# Patient Record
Sex: Male | Born: 2000 | Race: White | Hispanic: No | Marital: Single | Smoking: Never smoker
Health system: Southern US, Community
[De-identification: ages and names within clinical notes are randomized; demographics above are authoritative.]

## PROBLEM LIST (undated history)

## (undated) DIAGNOSIS — Z8489 Family history of other specified conditions: Secondary | ICD-10-CM

## (undated) DIAGNOSIS — K219 Gastro-esophageal reflux disease without esophagitis: Secondary | ICD-10-CM

## (undated) DIAGNOSIS — Z789 Other specified health status: Secondary | ICD-10-CM

---
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
## 2018-10-23 ENCOUNTER — Encounter (HOSPITAL_COMMUNITY): Payer: Self-pay | Admitting: *Deleted

## 2018-10-23 NOTE — Progress Notes (Signed)
Mr Mcmurry asked me to speak to his mother- Emanuel Stroble, with him on the phone also. Mr. Brister denies chest pain or shortness of breath. Patient denies that he nor his family has experienced any of the following: Cough Fever >100.4 Runny Nose Sore Throat Difficulty breathing/ shortness of breath Travel in past 14 days- no Patient is scheduled for COVID test at 1230 on Tuesday.  I instructed patient to not eat after midnight. I informed patient that he could have clear liquids until 1000.  I went over what clear liquids consist of with patient and his mother. They voiced understanding.

## 2018-10-27 ENCOUNTER — Encounter (HOSPITAL_COMMUNITY): Payer: Self-pay | Admitting: *Deleted

## 2018-10-27 ENCOUNTER — Encounter (HOSPITAL_COMMUNITY)
Admission: RE | Disposition: A | Discharge status: Home / Self Care | Payer: Self-pay | Source: Home / Self Care | Attending: Orthopedic Surgery

## 2018-10-27 ENCOUNTER — Other Ambulatory Visit: Payer: Self-pay

## 2018-10-27 ENCOUNTER — Ambulatory Visit (HOSPITAL_COMMUNITY)
Admission: RE | Admit: 2018-10-27 | Discharge: 2018-10-27 | Disposition: A | Discharge status: Home / Self Care | Payer: PPO | Attending: Orthopedic Surgery | Admitting: Orthopedic Surgery

## 2018-10-27 ENCOUNTER — Ambulatory Visit (HOSPITAL_COMMUNITY): Payer: PPO | Admitting: Certified Registered Nurse Anesthetist

## 2018-10-27 ENCOUNTER — Other Ambulatory Visit (HOSPITAL_COMMUNITY)
Admission: RE | Admit: 2018-10-27 | Discharge: 2018-10-27 | Disposition: A | Discharge status: Home / Self Care | Payer: PPO | Source: Ambulatory Visit | Attending: Orthopedic Surgery | Admitting: Orthopedic Surgery

## 2018-10-27 DIAGNOSIS — X58XXXA Exposure to other specified factors, initial encounter: Secondary | ICD-10-CM | POA: Insufficient documentation

## 2018-10-27 DIAGNOSIS — Z791 Long term (current) use of non-steroidal anti-inflammatories (NSAID): Secondary | ICD-10-CM | POA: Insufficient documentation

## 2018-10-27 DIAGNOSIS — S62615A Displaced fracture of proximal phalanx of left ring finger, initial encounter for closed fracture: Secondary | ICD-10-CM | POA: Diagnosis not present

## 2018-10-27 DIAGNOSIS — Z1159 Encounter for screening for other viral diseases: Secondary | ICD-10-CM | POA: Insufficient documentation

## 2018-10-27 DIAGNOSIS — K219 Gastro-esophageal reflux disease without esophagitis: Secondary | ICD-10-CM | POA: Diagnosis not present

## 2018-10-27 HISTORY — DX: Family history of other specified conditions: Z84.89

## 2018-10-27 HISTORY — DX: Other specified health status: Z78.9

## 2018-10-27 HISTORY — DX: Gastro-esophageal reflux disease without esophagitis: K21.9

## 2018-10-27 HISTORY — PX: OPEN REDUCTION INTERNAL FIXATION (ORIF) METACARPAL: SHX6234

## 2018-10-27 LAB — SARS CORONAVIRUS 2 BY RT PCR (HOSPITAL ORDER, PERFORMED IN ~~LOC~~ HOSPITAL LAB): SARS Coronavirus 2: NEGATIVE

## 2018-10-27 LAB — CBC
HCT: 49.1 % (ref 39.0–52.0)
Hemoglobin: 16.4 g/dL (ref 13.0–17.0)
MCH: 31.7 pg (ref 26.0–34.0)
MCHC: 33.4 g/dL (ref 30.0–36.0)
MCV: 94.8 fL (ref 80.0–100.0)
Platelets: 290 10*3/uL (ref 150–400)
RBC: 5.18 MIL/uL (ref 4.22–5.81)
RDW: 11.6 % (ref 11.5–15.5)
WBC: 8.4 10*3/uL (ref 4.0–10.5)
nRBC: 0 % (ref 0.0–0.2)

## 2018-10-27 SURGERY — OPEN REDUCTION INTERNAL FIXATION (ORIF) METACARPAL
Anesthesia: Monitor Anesthesia Care | Site: Hand | Laterality: Left

## 2018-10-27 MED ORDER — FENTANYL CITRATE (PF) 100 MCG/2ML IJ SOLN
INTRAMUSCULAR | Status: DC | PRN
  Administered 2018-10-27: 50 ug via INTRAVENOUS

## 2018-10-27 MED ORDER — OXYCODONE HCL 5 MG PO TABS: 5.0000 mg | ORAL_TABLET | Freq: Once | ORAL | Status: DC | PRN

## 2018-10-27 MED ORDER — FENTANYL CITRATE (PF) 100 MCG/2ML IJ SOLN
100.0000 ug | Freq: Once | INTRAMUSCULAR | Status: AC
  Administered 2018-10-27: 100 ug via INTRAVENOUS
  Filled 2018-10-27: qty 2

## 2018-10-27 MED ORDER — BUPIVACAINE HCL (PF) 0.25 % IJ SOLN
INTRAMUSCULAR | Status: AC
  Filled 2018-10-27: qty 30

## 2018-10-27 MED ORDER — CHLORHEXIDINE GLUCONATE 4 % EX LIQD: 60.0000 mL | Freq: Once | CUTANEOUS | Status: DC

## 2018-10-27 MED ORDER — CEPHALEXIN 500 MG PO CAPS: 500.0000 mg | ORAL_CAPSULE | Freq: Four times a day (QID) | ORAL | 0 refills | Status: AC

## 2018-10-27 MED ORDER — MIDAZOLAM HCL 2 MG/2ML IJ SOLN
2.0000 mg | Freq: Once | INTRAMUSCULAR | Status: AC
  Administered 2018-10-27: 2 mg via INTRAVENOUS
  Filled 2018-10-27: qty 2

## 2018-10-27 MED ORDER — TRAMADOL HCL 50 MG PO TABS: 50.0000 mg | ORAL_TABLET | Freq: Four times a day (QID) | ORAL | 0 refills | Status: AC | PRN

## 2018-10-27 MED ORDER — CEFAZOLIN SODIUM-DEXTROSE 2-4 GM/100ML-% IV SOLN
2.0000 g | INTRAVENOUS | Status: AC
  Administered 2018-10-27: 2 g via INTRAVENOUS

## 2018-10-27 MED ORDER — ONDANSETRON HCL 4 MG/2ML IJ SOLN
INTRAMUSCULAR | Status: AC
  Filled 2018-10-27: qty 2

## 2018-10-27 MED ORDER — ROPIVACAINE HCL 7.5 MG/ML IJ SOLN
INTRAMUSCULAR | Status: DC | PRN
  Administered 2018-10-27: 20 mL via PERINEURAL

## 2018-10-27 MED ORDER — FENTANYL CITRATE (PF) 100 MCG/2ML IJ SOLN: 25.0000 ug | INTRAMUSCULAR | Status: DC | PRN

## 2018-10-27 MED ORDER — ONDANSETRON HCL 4 MG/2ML IJ SOLN
INTRAMUSCULAR | Status: DC | PRN
  Administered 2018-10-27: 4 mg via INTRAVENOUS

## 2018-10-27 MED ORDER — OXYCODONE HCL 5 MG/5ML PO SOLN: 5.0000 mg | Freq: Once | ORAL | Status: DC | PRN

## 2018-10-27 MED ORDER — CEFAZOLIN SODIUM-DEXTROSE 2-4 GM/100ML-% IV SOLN
INTRAVENOUS | Status: AC
  Filled 2018-10-27: qty 100

## 2018-10-27 MED ORDER — FENTANYL CITRATE (PF) 250 MCG/5ML IJ SOLN
INTRAMUSCULAR | Status: AC
  Filled 2018-10-27: qty 5

## 2018-10-27 MED ORDER — PROPOFOL 10 MG/ML IV BOLUS
INTRAVENOUS | Status: DC | PRN
  Administered 2018-10-27: 30 mg via INTRAVENOUS
  Administered 2018-10-27 (×3): 50 mg via INTRAVENOUS

## 2018-10-27 MED ORDER — PROMETHAZINE HCL 25 MG/ML IJ SOLN: 6.2500 mg | INTRAMUSCULAR | Status: DC | PRN

## 2018-10-27 MED ORDER — 0.9 % SODIUM CHLORIDE (POUR BTL) OPTIME
TOPICAL | Status: DC | PRN
  Administered 2018-10-27: 1000 mL

## 2018-10-27 MED ORDER — LACTATED RINGERS IV SOLN
INTRAVENOUS | Status: DC
  Administered 2018-10-27: 13:00:00 1000 mL via INTRAVENOUS

## 2018-10-27 MED ORDER — PROPOFOL 500 MG/50ML IV EMUL
INTRAVENOUS | Status: DC | PRN
  Administered 2018-10-27: 50 ug/kg/min via INTRAVENOUS

## 2018-10-27 SURGICAL SUPPLY — 68 items
BANDAGE ACE 3X5.8 VEL STRL LF (GAUZE/BANDAGES/DRESSINGS) ×3 IMPLANT
BANDAGE ACE 4X5 VEL STRL LF (GAUZE/BANDAGES/DRESSINGS) ×3 IMPLANT
BANDAGE ELASTIC 4 VELCRO ST LF (GAUZE/BANDAGES/DRESSINGS) ×2 IMPLANT
BIT DRILL 1.1 (BIT) ×2
BIT DRILL 1.1MM (BIT) ×1
BIT DRILL 2 FAST STEP (BIT) ×2 IMPLANT
BIT DRILL 60X20X1.1XQC TMX (BIT) IMPLANT
BIT DRL 60X20X1.1XQC TMX (BIT) ×1
BLADE CLIPPER SURG (BLADE) IMPLANT
BNDG CMPR 9X4 STRL LF SNTH (GAUZE/BANDAGES/DRESSINGS) ×1
BNDG CONFORM 3 STRL LF (GAUZE/BANDAGES/DRESSINGS) ×2 IMPLANT
BNDG ESMARK 4X9 LF (GAUZE/BANDAGES/DRESSINGS) ×3 IMPLANT
BNDG GAUZE ELAST 4 BULKY (GAUZE/BANDAGES/DRESSINGS) ×5 IMPLANT
CORDS BIPOLAR (ELECTRODE) ×3 IMPLANT
COVER SURGICAL LIGHT HANDLE (MISCELLANEOUS) ×3 IMPLANT
COVER WAND RF STERILE (DRAPES) ×3 IMPLANT
CUFF TOURNIQUET SINGLE 18IN (TOURNIQUET CUFF) ×3 IMPLANT
CUFF TOURNIQUET SINGLE 24IN (TOURNIQUET CUFF) IMPLANT
DRAIN TLS ROUND 10FR (DRAIN) IMPLANT
DRAPE OEC MINIVIEW 54X84 (DRAPES) IMPLANT
DRAPE SURG 17X23 STRL (DRAPES) ×3 IMPLANT
DRSG MEPITEL 4X7.2 (GAUZE/BANDAGES/DRESSINGS) ×2 IMPLANT
DRSG XEROFORM 1X8 (GAUZE/BANDAGES/DRESSINGS) ×2 IMPLANT
GAUZE SPONGE 4X4 12PLY STRL (GAUZE/BANDAGES/DRESSINGS) ×3 IMPLANT
GAUZE SPONGE 4X4 12PLY STRL LF (GAUZE/BANDAGES/DRESSINGS) ×2 IMPLANT
GAUZE XEROFORM 1X8 LF (GAUZE/BANDAGES/DRESSINGS) ×3 IMPLANT
GLOVE BIOGEL M 8.0 STRL (GLOVE) ×3 IMPLANT
GLOVE SS BIOGEL STRL SZ 8 (GLOVE) ×1 IMPLANT
GLOVE SUPERSENSE BIOGEL SZ 8 (GLOVE) ×2
GOWN STRL REUS W/ TWL LRG LVL3 (GOWN DISPOSABLE) ×3 IMPLANT
GOWN STRL REUS W/ TWL XL LVL3 (GOWN DISPOSABLE) ×3 IMPLANT
GOWN STRL REUS W/TWL LRG LVL3 (GOWN DISPOSABLE) ×9
GOWN STRL REUS W/TWL XL LVL3 (GOWN DISPOSABLE) ×9
KIT BASIN OR (CUSTOM PROCEDURE TRAY) ×3 IMPLANT
KIT TURNOVER KIT B (KITS) ×3 IMPLANT
MANIFOLD NEPTUNE II (INSTRUMENTS) ×3 IMPLANT
NEEDLE 22X1 1/2 (OR ONLY) (NEEDLE) IMPLANT
NS IRRIG 1000ML POUR BTL (IV SOLUTION) ×3 IMPLANT
PACK ORTHO EXTREMITY (CUSTOM PROCEDURE TRAY) ×3 IMPLANT
PAD ARMBOARD 7.5X6 YLW CONV (MISCELLANEOUS) ×6 IMPLANT
PAD CAST 3X4 CTTN HI CHSV (CAST SUPPLIES) ×1 IMPLANT
PAD CAST 4YDX4 CTTN HI CHSV (CAST SUPPLIES) ×1 IMPLANT
PADDING CAST COTTON 3X4 STRL (CAST SUPPLIES) ×3
PADDING CAST COTTON 4X4 STRL (CAST SUPPLIES) ×3
SCREW NL 1.5X12 (Screw) ×2 IMPLANT
SCREW PEG 2.5X12 NONLOCK (Screw) ×2 IMPLANT
SCREW PEG 2.5X13 NONLOCK (Screw) ×2 IMPLANT
SCREW PEG 2.5X14 NONLOCK (Screw) ×2 IMPLANT
SCREW PEG 2.5X16 NONLOCK (Screw) ×2 IMPLANT
SCRUB BETADINE 4OZ XXX (MISCELLANEOUS) ×3 IMPLANT
SLING ARM IMMOBILIZER LRG (SOFTGOODS) ×2 IMPLANT
SOL PREP POV-IOD 4OZ 10% (MISCELLANEOUS) ×3 IMPLANT
SPLINT FIBERGLASS 3X12 (CAST SUPPLIES) ×2 IMPLANT
SPONGE LAP 4X18 RFD (DISPOSABLE) IMPLANT
SUT CHROMIC 5 0 P 3 (SUTURE) ×2 IMPLANT
SUT FIBER WIRE 4.0 (SUTURE) ×2 IMPLANT
SUT MNCRL AB 4-0 PS2 18 (SUTURE) ×3 IMPLANT
SUT PROLENE 3 0 PS 2 (SUTURE) IMPLANT
SUT PROLENE 4 0 PS 2 18 (SUTURE) ×2 IMPLANT
SUT VIC AB 3-0 FS2 27 (SUTURE) IMPLANT
SYR CONTROL 10ML LL (SYRINGE) IMPLANT
SYSTEM CHEST DRAIN TLS 7FR (DRAIN) IMPLANT
TOWEL OR 17X24 6PK STRL BLUE (TOWEL DISPOSABLE) ×3 IMPLANT
TOWEL OR 17X26 10 PK STRL BLUE (TOWEL DISPOSABLE) ×3 IMPLANT
TUBE CONNECTING 12'X1/4 (SUCTIONS)
TUBE CONNECTING 12X1/4 (SUCTIONS) ×1 IMPLANT
TUBE EVACUATION TLS (MISCELLANEOUS) ×1 IMPLANT
WATER STERILE IRR 1000ML POUR (IV SOLUTION) ×3 IMPLANT

## 2018-10-27 NOTE — Discharge Instructions (Signed)

## 2018-10-27 NOTE — Op Note (Signed)
Operative note 10/27/2018  Dominica Severin MD  Preoperative diagnosis: Left ring finger intra-articular MCP fracture  Postop diagnosis: The same  Procedure #1 open reduction internal fixation left ring finger MCP intra-articular fracture about the proximal phalanx.  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  Operative procedure patient was seen by myself and anesthesia taken to the operative theater underwent smooth induction general anesthetic as his block was somewhat incomplete we prepped him with Hibiclens pre-scrub followed by 10-minute surgical Betadine scrub and paint followed by timeout being observed.  Incision was made dorsally dissection was carried down extensor was split periosteal tissue was then split fracture was irrigated and curetted.  I then performed provisional reduction with clamp checked this under x-ray and then placed two 2.5 mm Biomet screws this allowed for coaxial compression and excellent reduction.  I then placed an additional 1.5 screw for a total of 3 screws.  A plate would not add any significant structural improvement and I did not choose to place any further hardware.  He was stable looked well I irrigated copiously.  I did perform arthrotomy synovectomy of the joint as he has some intra-articular debris in the joint and this was removed and irrigated copiously.  Following arthrotomy synovectomy and ORIF he demonstrated full range of motion no clicking catching or other  problems.  Patient tolerated this well he was closed with FiberWire and the periosteal sleeve was closed with 5-0 chromic the FiberWire was used on the tendon of course of the skin edge with 4-0 Prolene closure standard fashion.  Sterile dressing Mepitel Xeroform 4 x 4 gauze and a volar splint was applied the patient tolerated this well he will be monitored in recovery and DC'd on Keflex and pain management according to the regime discussed with his parents and himself.  I will see him back in 10 to 14 days will begin some early gentle range of motion at 4 weeks aggressive motion at 6 weeks strengthening all questions have been addressed    Radiographs look excellent with restoration of the joint and angulation look perfect in terms of the finger splay.  Dominica Severin MD

## 2018-10-27 NOTE — Anesthesia Preprocedure Evaluation (Addendum)
Anesthesia Evaluation  Patient identified by MRN, date of birth, ID band Patient awake    Reviewed: Allergy & Precautions, NPO status , Patient's Chart, lab work & pertinent test results  History of Anesthesia Complications Negative for: history of anesthetic complications  Airway Mallampati: II  TM Distance: >3 FB Neck ROM: Full    Dental  (+) Dental Advisory Given, Teeth Intact   Pulmonary neg pulmonary ROS,    breath sounds clear to auscultation       Cardiovascular negative cardio ROS   Rhythm:Regular Rate:Normal     Neuro/Psych negative neurological ROS  negative psych ROS   GI/Hepatic Neg liver ROS, GERD  Medicated and Controlled,  Endo/Other  negative endocrine ROS  Renal/GU negative Renal ROS     Musculoskeletal negative musculoskeletal ROS (+)   Abdominal   Peds  Hematology negative hematology ROS (+)   Anesthesia Other Findings   Reproductive/Obstetrics                            Anesthesia Physical Anesthesia Plan  ASA: I  Anesthesia Plan: MAC and Regional   Post-op Pain Management:    Induction: Intravenous  PONV Risk Score and Plan: 1 and Propofol infusion and Treatment may vary due to age or medical condition  Airway Management Planned: Natural Airway and Simple Face Mask  Additional Equipment: None  Intra-op Plan:   Post-operative Plan:   Informed Consent: I have reviewed the patients History and Physical, chart, labs and discussed the procedure including the risks, benefits and alternatives for the proposed anesthesia with the patient or authorized representative who has indicated his/her understanding and acceptance.       Plan Discussed with: CRNA and Anesthesiologist  Anesthesia Plan Comments:        Anesthesia Quick Evaluation

## 2018-10-27 NOTE — H&P (Signed)
Anthony Carr is an 18 y.o. male.   Chief Complaint: Fracture left ring finger MCP joint comminuted and displaced HPI: Patient presents for evaluation and treatment of the of their upper extremity predicament. The patient denies neck, back, chest or  abdominal pain. The patient notes that they have no lower extremity problems. The patients primary complaint is noted. We are planning surgical care pathway for the upper extremity.  Past Medical History:  Diagnosis Date  . Family history of adverse reaction to anesthesia    PGM - Nausea  . GERD (gastroesophageal reflux disease)    occasional  . Medical history non-contributory     Past Surgical History:  Procedure Laterality Date  . WISDOM TOOTH EXTRACTION      History reviewed. No pertinent family history. Social History:  reports that he has never smoked. He has never used smokeless tobacco. He reports that he does not drink alcohol or use drugs.  Allergies: No Known Allergies  Medications Prior to Admission  Medication Sig Dispense Refill  . acetaminophen (TYLENOL) 500 MG tablet Take 500-1,000 mg by mouth every 8 (eight) hours as needed (for pain.).    Marland Kitchen. calcium carbonate (TUMS - DOSED IN MG ELEMENTAL CALCIUM) 500 MG chewable tablet Chew 1 tablet by mouth daily as needed for indigestion or heartburn.    Marland Kitchen. ibuprofen (ADVIL) 200 MG tablet Take 400 mg by mouth every 8 (eight) hours as needed (for pain.).      No results found for this or any previous visit (from the past 48 hour(s)). No results found.  Review of Systems  Respiratory: Negative.   Cardiovascular: Negative.   Gastrointestinal: Negative.   Genitourinary: Negative.     Blood pressure 113/79, pulse 83, temperature 98.5 F (36.9 C), temperature source Oral, resp. rate 18, height 5\' 8"  (1.727 m), weight 59 kg, SpO2 100 %. Physical Exam  Proximal phalanx fracture intra-articular with displacement MCP joint left hand with rotatory malalignment and deformity intact  sensation and motor function to the adjacent digits intact sensation and refill to the affected ring finger.  There is a large amount of ecchymosis and hematoma as expected.     The patient is alert and oriented in no acute distress. The patient complains of pain in the affected upper extremity.  The patient is noted to have a normal HEENT exam. Lung fields show equal chest expansion and no shortness of breath. Abdomen exam is nontender without distention. Lower extremity examination does not show any fracture dislocation or blood clot symptoms. Pelvis is stable and the neck and back are stable and nontender.   Assessment/Plan Plan for repair reconstruction/ORIF left ring finger MCP joint fracture including proximal phalanx  We are planning surgery for your upper extremity. The risk and benefits of surgery to include risk of bleeding, infection, anesthesia,  damage to normal structures and failure of the surgery to accomplish its intended goals of relieving symptoms and restoring function have been discussed in detail. With this in mind we plan to proceed. I have specifically discussed with the patient the pre-and postoperative regime and the dos and don'ts and risk and benefits in great detail. Risk and benefits of surgery also include risk of dystrophy(CRPS), chronic nerve pain, failure of the healing process to go onto completion and other inherent risks of surgery The relavent the pathophysiology of the disease/injury process, as well as the alternatives for treatment and postoperative course of action has been discussed in great detail with the patient who desires to proceed.  We will do everything in our power to help you (the patient) restore function to the upper extremity. It is a pleasure to see this patient today.   Oletta Cohn III, MD 10/27/2018, 1:35 PM

## 2018-10-27 NOTE — Anesthesia Procedure Notes (Addendum)
Anesthesia Regional Block: Axillary brachial plexus block   Pre-Anesthetic Checklist: ,, timeout performed, Correct Patient, Correct Site, Correct Laterality, Correct Procedure, Correct Position, site marked, Risks and benefits discussed,  Surgical consent,  Pre-op evaluation,  At surgeon's request and post-op pain management  Laterality: Left  Prep: chloraprep       Needles:  Injection technique: Single-shot  Needle Type: Echogenic Needle     Needle Length: 5cm  Needle Gauge: 21     Additional Needles:   Narrative:  Start time: 10/27/2018 3:50 PM End time: 10/27/2018 3:55 PM Injection made incrementally with aspirations every 5 mL.  Performed by: Personally  Anesthesiologist: Beryle Lathe, MD  Additional Notes: No pain on injection. No increased resistance to injection. Injection made in 5cc increments. Good needle visualization. Patient tolerated the procedure well.

## 2018-10-27 NOTE — Transfer of Care (Signed)
Immediate Anesthesia Transfer of Care Note  Patient: Anthony Carr  Procedure(s) Performed: Open reduction internal fixation left ring finger intra-articular metacarpal phalangeal fracture involving proximal phalanx and repair reconstruction as necessary (Left Hand)  Patient Location: PACU  Anesthesia Type:GA combined with regional for post-op pain  Level of Consciousness: drowsy and patient cooperative  Airway & Oxygen Therapy: Patient Spontanous Breathing  Post-op Assessment: Report given to RN and Post -op Vital signs reviewed and stable  Post vital signs: Reviewed and stable  Last Vitals:  Vitals Value Taken Time  BP 115/85 10/27/2018  6:21 PM  Temp    Pulse 74 10/27/2018  6:21 PM  Resp 17 10/27/2018  6:21 PM  SpO2 100 % 10/27/2018  6:21 PM  Vitals shown include unvalidated device data.  Last Pain:  Vitals:   10/27/18 1605  TempSrc:   PainSc: 0-No pain      Patients Stated Pain Goal: 4 (10/27/18 1535)  Complications: No apparent anesthesia complications

## 2018-10-27 NOTE — Anesthesia Procedure Notes (Signed)
Procedure Name: LMA Insertion Date/Time: 10/27/2018 4:58 PM Performed by: Rosiland Oz, CRNA Pre-anesthesia Checklist: Patient identified, Emergency Drugs available, Suction available, Patient being monitored and Timeout performed Patient Re-evaluated:Patient Re-evaluated prior to induction Oxygen Delivery Method: Circle system utilized Preoxygenation: Pre-oxygenation with 100% oxygen Induction Type: IV induction LMA: LMA inserted LMA Size: 4.0 Number of attempts: 1 Placement Confirmation: positive ETCO2 and breath sounds checked- equal and bilateral Tube secured with: Tape Dental Injury: Teeth and Oropharynx as per pre-operative assessment

## 2018-10-28 ENCOUNTER — Encounter (HOSPITAL_COMMUNITY): Payer: Self-pay | Admitting: Orthopedic Surgery

## 2018-10-28 NOTE — Anesthesia Postprocedure Evaluation (Signed)
Anesthesia Post Note  Patient: Anthony Carr  Procedure(s) Performed: Open reduction internal fixation left ring finger intra-articular metacarpal phalangeal fracture involving proximal phalanx and repair reconstruction as necessary (Left Hand)     Patient location during evaluation: PACU Anesthesia Type: General Level of consciousness: awake and alert Pain management: pain level controlled Vital Signs Assessment: post-procedure vital signs reviewed and stable Respiratory status: spontaneous breathing, nonlabored ventilation and respiratory function stable Cardiovascular status: blood pressure returned to baseline and stable Postop Assessment: no apparent nausea or vomiting Anesthetic complications: no    Last Vitals:  Vitals:   10/27/18 1836 10/27/18 1845  BP: 113/77   Pulse:  68  Resp:  20  Temp:    SpO2:  100%    Last Pain:  Vitals:   10/27/18 1845  TempSrc:   PainSc: 0-No pain                 Beryle Lathe
